# Patient Record
Sex: Female | Born: 1965 | Race: White | Hispanic: No | Marital: Married | Smoking: Never smoker
Health system: Southern US, Community
[De-identification: ages and names within clinical notes are randomized; demographics above are authoritative.]

---

## 2014-07-18 ENCOUNTER — Telehealth: Payer: Self-pay | Admitting: Primary Care

## 2014-07-18 NOTE — Telephone Encounter (Signed)
Patient scheduled a new patient appointment with Jae Dire.  Patient would like to have physical done on the same day as her appointment.  Can patient have a physical done on the same day?

## 2014-07-18 NOTE — Telephone Encounter (Signed)
Yes, that's fine. Will you please ensure we have enough time. Perhaps 45 min slot?  Thanks!

## 2014-07-29 ENCOUNTER — Ambulatory Visit: Payer: Self-pay | Admitting: Primary Care

## 2014-09-11 ENCOUNTER — Encounter: Payer: Self-pay | Admitting: Family Medicine

## 2014-09-11 ENCOUNTER — Other Ambulatory Visit (HOSPITAL_COMMUNITY)
Admission: RE | Admit: 2014-09-11 | Discharge: 2014-09-11 | Disposition: A | Discharge status: Home / Self Care | Payer: 59 | Source: Ambulatory Visit | Attending: Family Medicine | Admitting: Family Medicine

## 2014-09-11 ENCOUNTER — Ambulatory Visit (INDEPENDENT_AMBULATORY_CARE_PROVIDER_SITE_OTHER): Payer: 59 | Admitting: Family Medicine

## 2014-09-11 ENCOUNTER — Encounter (INDEPENDENT_AMBULATORY_CARE_PROVIDER_SITE_OTHER): Payer: Self-pay

## 2014-09-11 VITALS — BP 104/68 | HR 66 | Temp 98.6°F | Ht 61.0 in | Wt 137.0 lb

## 2014-09-11 DIAGNOSIS — Z1151 Encounter for screening for human papillomavirus (HPV): Secondary | ICD-10-CM | POA: Diagnosis present

## 2014-09-11 DIAGNOSIS — Z01419 Encounter for gynecological examination (general) (routine) without abnormal findings: Secondary | ICD-10-CM | POA: Insufficient documentation

## 2014-09-11 DIAGNOSIS — N941 Dyspareunia: Secondary | ICD-10-CM

## 2014-09-11 DIAGNOSIS — Z13220 Encounter for screening for lipoid disorders: Secondary | ICD-10-CM | POA: Diagnosis not present

## 2014-09-11 DIAGNOSIS — N898 Other specified noninflammatory disorders of vagina: Secondary | ICD-10-CM

## 2014-09-11 DIAGNOSIS — IMO0002 Reserved for concepts with insufficient information to code with codable children: Secondary | ICD-10-CM

## 2014-09-11 DIAGNOSIS — Z0000 Encounter for general adult medical examination without abnormal findings: Secondary | ICD-10-CM | POA: Insufficient documentation

## 2014-09-11 DIAGNOSIS — N938 Other specified abnormal uterine and vaginal bleeding: Secondary | ICD-10-CM | POA: Insufficient documentation

## 2014-09-11 NOTE — Assessment & Plan Note (Addendum)
No abnormality seen, ? Bartholin's versus other causing pain, swelling off and on. Pt to return when lesion present.

## 2014-09-11 NOTE — Progress Notes (Signed)
Pre visit review using our clinic review tool, if applicable. No additional management support is needed unless otherwise documented below in the visit note. 

## 2014-09-11 NOTE — Assessment & Plan Note (Signed)
Eval with labs, pap and US transvaginal and pelvic.  No abnormality seen on exam.

## 2014-09-11 NOTE — Progress Notes (Signed)
Subjective:    Patient ID: Laura Cox, female    DOB: 15-Mar-1965, 49 y.o.   MRN: [id number]  HPI 49 year old female presents to establish.  No recent MD. She has not had  CPX.   She has very heavy menses, occurs monthly but has to use super plus tampon and a pad for first 3 days, last 5-7 days. She has had DUB in last 3 years. She does have cramping in low abdomen.  No easy bruising, no bleeding.    When she has sex she has some pain in last 6-8 month.  She has  pain at vaginal lips at right side during intercourse. Occ feels hard ball/mass (size of BB)  to side.  Ache , no burning.  No pain deep inside.  No vaginal discharge.  No concern of STDs.    Review of Systems  Constitutional: Negative for fever and fatigue.  HENT: Negative for congestion.   Eyes: Negative for pain.  Respiratory: Negative for cough and shortness of breath.   Cardiovascular: Negative for chest pain, palpitations and leg swelling.  Gastrointestinal: Negative for abdominal pain.  Genitourinary: Negative for dysuria and vaginal bleeding.  Musculoskeletal: Positive for back pain.  Neurological: Negative for syncope, light-headedness and headaches.  Psychiatric/Behavioral: Negative for dysphoric mood.       Objective:    Physical Exam  Constitutional: Vital signs are normal. She appears well-developed and well-nourished. She is cooperative.  Non-toxic appearance. She does not appear ill. No distress.  HENT:  Head: Normocephalic.  Right Ear: Hearing, tympanic membrane, external ear and ear canal normal.  Left Ear: Hearing, tympanic membrane, external ear and ear canal normal.  Nose: Nose normal.  Eyes: Conjunctivae, EOM and lids are normal. Pupils are equal, round, and reactive to light. Lids are everted and swept, no foreign bodies found.  Neck: Trachea normal and normal range of motion. Neck supple. Carotid bruit is not present. No thyroid mass and no thyromegaly present.  Cardiovascular:  Normal rate, regular rhythm, S1 normal, S2 normal, normal heart sounds and intact distal pulses.  Exam reveals no gallop.   No murmur heard. Pulmonary/Chest: Effort normal and breath sounds normal. No respiratory distress. She has no wheezes. She has no rhonchi. She has no rales.  Abdominal: Soft. Normal appearance and bowel sounds are normal. She exhibits no distension, no fluid wave, no abdominal bruit and no mass. There is no hepatosplenomegaly. There is no tenderness. There is no rebound, no guarding and no CVA tenderness. No hernia.  Genitourinary: Vagina normal and uterus normal. No breast swelling, tenderness, discharge or bleeding. Pelvic exam was performed with patient supine. There is no rash, tenderness or lesion on the right labia. There is no rash, tenderness or lesion on the left labia. Uterus is not enlarged and not tender. Cervix exhibits no motion tenderness, no discharge and no friability. Right adnexum displays no mass, no tenderness and no fullness. Left adnexum displays no mass, no tenderness and no fullness.  Lymphadenopathy:    She has no cervical adenopathy.    She has no axillary adenopathy.  Neurological: She is alert. She has normal strength. No cranial nerve deficit or sensory deficit.  Skin: Skin is warm, dry and intact. No rash noted.  Psychiatric: Her speech is normal and behavior is normal. Judgment normal. Her mood appears not anxious. Cognition and memory are normal. She does not exhibit a depressed mood.          Assessment & Plan:  The patient's preventative maintenance and recommended screening tests for an annual wellness exam were reviewed in full today. Brought up to date unless services declined.  Counselled on the importance of diet, exercise, and its role in overall health and mortality. The patient's FH and SH was reviewed, including their home life, tobacco status, and drug and alcohol status.   Mammogram; due. Vaccines: uptodate Due for lab  eval. Nonsmoker STD testing.

## 2014-09-11 NOTE — Patient Instructions (Addendum)
Call to set up mammogram on your own.  Set up a lab only appt for eval.  Stop at front desk to set up ultrasound.

## 2014-09-17 ENCOUNTER — Other Ambulatory Visit: Payer: 59

## 2014-09-17 LAB — CYTOLOGY - PAP

## 2014-09-18 ENCOUNTER — Other Ambulatory Visit: Payer: 59

## 2014-09-18 ENCOUNTER — Encounter: Payer: Self-pay | Admitting: *Deleted

## 2014-09-19 ENCOUNTER — Telehealth: Payer: Self-pay

## 2014-09-19 MED ORDER — ALPRAZOLAM 0.25 MG PO TABS: ORAL_TABLET | ORAL | Status: DC

## 2014-09-19 NOTE — Telephone Encounter (Signed)
Pt left note; pt seen 09/11/14 and is scheduled for testing on 09/22/14; pt discussed at 09/11/14 appt about getting Valium to take prior to testing on 09/22/14. Pt request med sent to [facility] 09/19/14. Pt request cb.

## 2014-09-19 NOTE — Telephone Encounter (Signed)
Laura Cox notified prescription has been called into her pharmacy.

## 2014-09-19 NOTE — Telephone Encounter (Signed)
Alprazolam called into [facility]. 

## 2014-09-22 ENCOUNTER — Ambulatory Visit
Admission: RE | Admit: 2014-09-22 | Discharge: 2014-09-22 | Disposition: A | Discharge status: Home / Self Care | Payer: 59 | Source: Ambulatory Visit | Attending: Family Medicine | Admitting: Family Medicine

## 2014-09-22 DIAGNOSIS — IMO0002 Reserved for concepts with insufficient information to code with codable children: Secondary | ICD-10-CM

## 2014-09-22 DIAGNOSIS — N938 Other specified abnormal uterine and vaginal bleeding: Secondary | ICD-10-CM

## 2014-09-23 ENCOUNTER — Telehealth: Payer: Self-pay | Admitting: Family Medicine

## 2014-09-23 ENCOUNTER — Other Ambulatory Visit: Payer: 59

## 2014-09-23 DIAGNOSIS — IMO0002 Reserved for concepts with insufficient information to code with codable children: Secondary | ICD-10-CM

## 2014-09-23 DIAGNOSIS — N938 Other specified abnormal uterine and vaginal bleeding: Secondary | ICD-10-CM

## 2014-09-23 DIAGNOSIS — N898 Other specified noninflammatory disorders of vagina: Secondary | ICD-10-CM

## 2014-09-23 NOTE — Telephone Encounter (Signed)
Patient is asking for the results of her ultrasound done yesterday.

## 2014-09-23 NOTE — Telephone Encounter (Signed)
Please send recent US results.

## 2014-09-24 ENCOUNTER — Telehealth: Payer: Self-pay | Admitting: Family Medicine

## 2014-09-24 NOTE — Telephone Encounter (Signed)
Made pt appt with gyn. Pt is requesting call back with explanation on why one of the dx code was vaginal lesion. She said letter in mail stated pap was normal. This is concerning for the pt.   Please call pt on cell.

## 2014-09-25 ENCOUNTER — Other Ambulatory Visit (INDEPENDENT_AMBULATORY_CARE_PROVIDER_SITE_OTHER): Payer: 59

## 2014-09-25 DIAGNOSIS — N938 Other specified abnormal uterine and vaginal bleeding: Secondary | ICD-10-CM | POA: Diagnosis not present

## 2014-09-25 DIAGNOSIS — Z13220 Encounter for screening for lipoid disorders: Secondary | ICD-10-CM | POA: Diagnosis not present

## 2014-09-25 NOTE — Telephone Encounter (Signed)
Left message for Laura Cox to return my call.

## 2014-09-25 NOTE — Telephone Encounter (Signed)
Let pt know that there was no other clear way to code for the nodule she has felt at times in her genital area. She is correct no internal vaginal issue. I am sorry to have concerned her.

## 2014-09-26 ENCOUNTER — Encounter: Payer: Self-pay | Admitting: *Deleted

## 2014-09-26 LAB — CBC WITH DIFFERENTIAL/PLATELET
BASOS PCT: 0.4 % (ref 0.0–3.0)
Basophils Absolute: 0 10*3/uL (ref 0.0–0.1)
EOS ABS: 0.1 10*3/uL (ref 0.0–0.7)
Eosinophils Relative: 1.3 % (ref 0.0–5.0)
HEMATOCRIT: 37.8 % (ref 36.0–46.0)
Hemoglobin: 12.5 g/dL (ref 12.0–15.0)
LYMPHS PCT: 20.1 % (ref 12.0–46.0)
Lymphs Abs: 1.8 10*3/uL (ref 0.7–4.0)
MCHC: 33.2 g/dL (ref 30.0–36.0)
MCV: 90.5 fl (ref 78.0–100.0)
MONOS PCT: 6.6 % (ref 3.0–12.0)
Monocytes Absolute: 0.6 10*3/uL (ref 0.1–1.0)
NEUTROS ABS: 6.3 10*3/uL (ref 1.4–7.7)
Neutrophils Relative %: 71.6 % (ref 43.0–77.0)
PLATELETS: 182 10*3/uL (ref 150.0–400.0)
RBC: 4.18 Mil/uL (ref 3.87–5.11)
RDW: 13.4 % (ref 11.5–15.5)
WBC: 8.8 10*3/uL (ref 4.0–10.5)

## 2014-09-26 LAB — COMPREHENSIVE METABOLIC PANEL
ALT: 13 U/L (ref 0–35)
AST: 20 U/L (ref 0–37)
Albumin: 4 g/dL (ref 3.5–5.2)
Alkaline Phosphatase: 56 U/L (ref 39–117)
BUN: 16 mg/dL (ref 6–23)
CALCIUM: 9.1 mg/dL (ref 8.4–10.5)
CHLORIDE: 102 meq/L (ref 96–112)
CO2: 25 meq/L (ref 19–32)
Creatinine, Ser: 0.92 mg/dL (ref 0.40–1.20)
GFR: 69.56 mL/min (ref >60.00)
Glucose, Bld: 70 mg/dL (ref 70–99)
POTASSIUM: 4.3 meq/L (ref 3.5–5.1)
Sodium: 138 mEq/L (ref 135–145)
Total Bilirubin: 0.3 mg/dL (ref 0.2–1.2)
Total Protein: 7 g/dL (ref 6.0–8.3)

## 2014-09-26 LAB — LIPID PANEL
CHOL/HDL RATIO: 2
Cholesterol: 167 mg/dL (ref 0–200)
HDL: 75 mg/dL (ref >39.00)
LDL CALC: 81 mg/dL (ref 0–99)
NONHDL: 91.95
TRIGLYCERIDES: 55 mg/dL (ref 0.0–149.0)
VLDL: 11 mg/dL (ref 0.0–40.0)

## 2014-09-26 LAB — TSH: TSH: 2.28 u[IU]/mL (ref 0.35–4.50)

## 2014-09-26 NOTE — Telephone Encounter (Signed)
[name] notified as instructed by telephone. 

## 2015-01-01 ENCOUNTER — Ambulatory Visit: Payer: Self-pay | Admitting: Family Medicine

## 2015-09-25 ENCOUNTER — Ambulatory Visit (INDEPENDENT_AMBULATORY_CARE_PROVIDER_SITE_OTHER): Payer: 59 | Admitting: Family Medicine

## 2015-09-25 ENCOUNTER — Encounter: Payer: Self-pay | Admitting: Family Medicine

## 2015-09-25 DIAGNOSIS — N938 Other specified abnormal uterine and vaginal bleeding: Secondary | ICD-10-CM

## 2015-09-25 MED ORDER — LO LOESTRIN FE 1 MG-10 MCG / 10 MCG PO TABS: 1.0000 | ORAL_TABLET | Freq: Every day | ORAL | 11 refills | Status: DC

## 2015-09-25 NOTE — Progress Notes (Signed)
Pre visit review using our clinic review tool, if applicable. No additional management support is needed unless otherwise documented below in the visit note. 

## 2015-09-25 NOTE — Assessment & Plan Note (Signed)
Improved on OCPS.  Refilled.  If symptoms returning or wish to stop estrogen containing OCPs.. She may return to GYN for ablation.

## 2015-09-25 NOTE — Progress Notes (Signed)
   Subjective:    Patient ID: Laura Cox, female    DOB: 02-19-1965, 50 y.o.   MRN: [id number]  HPI   50 year old female presents for medication refill.  DUB noted at 2016 CPX. US showed thickened endometrium  Referred to GYN. Had  Internal eval.. No issues. Recommended ablation or OCP.  She chose OCPs. She has been on this for the last 8 months.  He menses has been more regular, less flow.  No SE to Lo Loestrin.    Review of Systems  Constitutional: Negative for fatigue and fever.  HENT: Negative for ear pain.   Eyes: Negative for pain.  Respiratory: Negative for chest tightness and shortness of breath.   Cardiovascular: Negative for chest pain, palpitations and leg swelling.  Gastrointestinal: Negative for abdominal pain.  Genitourinary: Negative for dysuria.       Objective:   Physical Exam  Constitutional: Vital signs are normal. She appears well-developed and well-nourished. She is cooperative.  Non-toxic appearance. She does not appear ill. No distress.  HENT:  Head: Normocephalic.  Right Ear: Hearing, tympanic membrane, external ear and ear canal normal. Tympanic membrane is not erythematous, not retracted and not bulging.  Left Ear: Hearing, tympanic membrane, external ear and ear canal normal. Tympanic membrane is not erythematous, not retracted and not bulging.  Nose: No mucosal edema or rhinorrhea. Right sinus exhibits no maxillary sinus tenderness and no frontal sinus tenderness. Left sinus exhibits no maxillary sinus tenderness and no frontal sinus tenderness.  Mouth/Throat: Uvula is midline, oropharynx is clear and moist and mucous membranes are normal.  Eyes: Conjunctivae, EOM and lids are normal. Pupils are equal, round, and reactive to light. Lids are everted and swept, no foreign bodies found.  Neck: Trachea normal and normal range of motion. Neck supple. Carotid bruit is not present. No thyroid mass and no thyromegaly present.  Cardiovascular: Normal  rate, regular rhythm, S1 normal, S2 normal, normal heart sounds, intact distal pulses and normal pulses.  Exam reveals no gallop and no friction rub.   No murmur heard. Pulmonary/Chest: Effort normal and breath sounds normal. No tachypnea. No respiratory distress. She has no decreased breath sounds. She has no wheezes. She has no rhonchi. She has no rales.  Abdominal: Soft. Normal appearance and bowel sounds are normal. There is no tenderness.  Neurological: She is alert.  Skin: Skin is warm, dry and intact. No rash noted.  Psychiatric: Her speech is normal and behavior is normal. Judgment and thought content normal. Her mood appears not anxious. Cognition and memory are normal. She does not exhibit a depressed mood.          Assessment & Plan:

## 2015-12-17 ENCOUNTER — Encounter: Payer: 59 | Admitting: Family Medicine

## 2016-02-23 ENCOUNTER — Encounter: Payer: 59 | Admitting: Family Medicine

## 2016-03-17 ENCOUNTER — Encounter: Payer: 59 | Admitting: Family Medicine

## 2016-06-30 ENCOUNTER — Encounter: Payer: 59 | Admitting: Family Medicine

## 2016-07-22 ENCOUNTER — Encounter: Payer: 59 | Admitting: Family Medicine

## 2016-09-06 ENCOUNTER — Encounter: Payer: Self-pay | Admitting: Family Medicine

## 2016-09-06 ENCOUNTER — Ambulatory Visit (INDEPENDENT_AMBULATORY_CARE_PROVIDER_SITE_OTHER): Payer: 59 | Admitting: Family Medicine

## 2016-09-06 VITALS — BP 110/62 | HR 76 | Temp 98.7°F | Ht 61.0 in | Wt 123.5 lb

## 2016-09-06 DIAGNOSIS — Z13220 Encounter for screening for lipoid disorders: Secondary | ICD-10-CM

## 2016-09-06 DIAGNOSIS — Z0000 Encounter for general adult medical examination without abnormal findings: Secondary | ICD-10-CM | POA: Diagnosis not present

## 2016-09-06 NOTE — Patient Instructions (Addendum)
Return for fasting labs when able.  Keep up healthy eating habits and  regular exercise.

## 2016-09-06 NOTE — Progress Notes (Signed)
Subjective:    Patient ID: Laura Cox, female    DOB: [birth year], 51 y.o.   MRN: [id number]  HPI The patient is here for annual wellness exam and preventative care.    Now on OCPs for DUB.  She does not bleed  When is supposed to   Light  bleeding mid cycle for 1 week.  Occ does not have menses.  Exercise: regular exercise 3-5 days a week  Body mass index is 23.34 kg/m. Diet:  healthy diet, water, calcium   Blood pressure 110/62, pulse 76, temperature 98.7 F (37.1 C), temperature source Oral, height 5\' 1"  (1.549 m), weight 123 lb 8 oz (56 kg). Social History /Family History/Past Medical History reviewed in detail and updated in EMR if needed.  Review of Systems  Constitutional: Negative for fatigue and fever.  HENT: Negative for congestion.   Eyes: Negative for pain.  Respiratory: Negative for cough and shortness of breath.   Cardiovascular: Negative for chest pain, palpitations and leg swelling.  Gastrointestinal: Negative for abdominal pain.  Genitourinary: Negative for dysuria and vaginal bleeding.  Musculoskeletal: Negative for back pain.  Neurological: Negative for syncope, light-headedness and headaches.  Psychiatric/Behavioral: Negative for dysphoric mood.       Objective:   Physical Exam  Constitutional: Vital signs are normal. She appears well-developed and well-nourished. She is cooperative.  Non-toxic appearance. She does not appear ill. No distress.  HENT:  Head: Normocephalic.  Right Ear: Hearing, tympanic membrane, external ear and ear canal normal.  Left Ear: Hearing, tympanic membrane, external ear and ear canal normal.  Nose: Nose normal.  Eyes: Pupils are equal, round, and reactive to light. Conjunctivae, EOM and lids are normal. Lids are everted and swept, no foreign bodies found.  Neck: Trachea normal and normal range of motion. Neck supple. Carotid bruit is not present. No thyroid mass and no thyromegaly present.  Cardiovascular: Normal  rate, regular rhythm, S1 normal, S2 normal, normal heart sounds and intact distal pulses.  Exam reveals no gallop.   No murmur heard. Pulmonary/Chest: Effort normal and breath sounds normal. No respiratory distress. She has no wheezes. She has no rhonchi. She has no rales.  Abdominal: Soft. Normal appearance and bowel sounds are normal. She exhibits no distension, no fluid wave, no abdominal bruit and no mass. There is no hepatosplenomegaly. There is no tenderness. There is no rebound, no guarding and no CVA tenderness. No hernia.  Lymphadenopathy:    She has no cervical adenopathy.    She has no axillary adenopathy.  Neurological: She is alert. She has normal strength. No cranial nerve deficit or sensory deficit.  Skin: Skin is warm, dry and intact. No rash noted.  Psychiatric: Her speech is normal and behavior is normal. Judgment normal. Her mood appears not anxious. Cognition and memory are normal. She does not exhibit a depressed mood.          Assessment & Plan:  The patient's preventative maintenance and recommended screening tests for an annual wellness exam were reviewed in full today. Brought up to date unless services declined.  Counselled on the importance of diet, exercise, and its role in overall health and mortality. The patient's FH and SH was reviewed, including their home life, tobacco status, and drug and alcohol status.   Vaccines: uptodate Pap/DVE:  2016 pap nml, neg hpv, repeat in 5 years Mammo:  She plans starting age 51 given low risk. Colon:  No early family history. Smoking Status: non smoker ETOH/ drug  ZOX:WRUE  HIV screen:  refused

## 2016-09-16 ENCOUNTER — Other Ambulatory Visit: Payer: 59

## 2016-09-26 ENCOUNTER — Other Ambulatory Visit: Payer: Self-pay | Admitting: Family Medicine

## 2017-10-18 ENCOUNTER — Other Ambulatory Visit: Payer: Self-pay | Admitting: Family Medicine

## 2017-10-19 ENCOUNTER — Telehealth: Payer: Self-pay | Admitting: Family Medicine

## 2017-10-19 MED ORDER — LO LOESTRIN FE 1 MG-10 MCG / 10 MCG PO TABS: 1.0000 | ORAL_TABLET | Freq: Every day | ORAL | 2 refills | Status: DC

## 2017-10-19 NOTE — Telephone Encounter (Signed)
Refill was sent in yesterday for 1 pack.  I called [facility] and ask that they add on 2 additional refills.  That should get Laura Cox to her appointment in December.

## 2017-10-19 NOTE — Addendum Note (Signed)
Addended by: Damita LackLORING, Malaisha Silliman S on: 10/19/2017 11:32 AM   Modules accepted: Orders

## 2017-10-19 NOTE — Telephone Encounter (Signed)
Copied from CRM [phone]. Topic: Quick Communication - Office Called Patient >> Oct 19, 2017 10:19 AM Carrie MewHayes, Robin B wrote: Reason for CRM: left message asking pt to call office.  Please schedule physical with fasting labs prior with dr Ermalene Searingbedsole  next physical appointment 12/13 >> Oct 19, 2017 10:57 AM Carin PrimroseWalter, Linda F wrote: Patient made an appt on Friday, 01/12/18 at 2:30pm.  However she will run out of her birth control meds before that appt.  She would like to have that prescription refilled up until her physical.

## 2017-11-19 ENCOUNTER — Emergency Department (HOSPITAL_COMMUNITY)
Admission: EM | Admit: 2017-11-19 | Discharge: 2017-11-19 | Disposition: A | Discharge status: Home / Self Care | Payer: 59 | Attending: Emergency Medicine | Admitting: Emergency Medicine

## 2017-11-19 ENCOUNTER — Encounter (HOSPITAL_COMMUNITY): Payer: Self-pay | Admitting: Emergency Medicine

## 2017-11-19 ENCOUNTER — Emergency Department (HOSPITAL_COMMUNITY): Payer: 59

## 2017-11-19 DIAGNOSIS — R200 Anesthesia of skin: Secondary | ICD-10-CM | POA: Diagnosis present

## 2017-11-19 DIAGNOSIS — Z79899 Other long term (current) drug therapy: Secondary | ICD-10-CM | POA: Insufficient documentation

## 2017-11-19 DIAGNOSIS — H8110 Benign paroxysmal vertigo, unspecified ear: Secondary | ICD-10-CM | POA: Insufficient documentation

## 2017-11-19 LAB — URINALYSIS, ROUTINE W REFLEX MICROSCOPIC
Bilirubin Urine: NEGATIVE
Glucose, UA: NEGATIVE mg/dL
Hgb urine dipstick: NEGATIVE
KETONES UR: 5 mg/dL — AB
LEUKOCYTES UA: NEGATIVE
NITRITE: NEGATIVE
PH: 9 — AB (ref 5.0–8.0)
PROTEIN: NEGATIVE mg/dL
Specific Gravity, Urine: 1.014 (ref 1.005–1.030)

## 2017-11-19 LAB — BASIC METABOLIC PANEL
ANION GAP: 9 (ref 5–15)
BUN: 14 mg/dL (ref 6–20)
CHLORIDE: 103 mmol/L (ref 98–111)
CO2: 26 mmol/L (ref 22–32)
CREATININE: 0.96 mg/dL (ref 0.44–1.00)
Calcium: 9.1 mg/dL (ref 8.9–10.3)
GFR calc non Af Amer: >60 mL/min (ref >60)
Glucose, Bld: 112 mg/dL — ABNORMAL HIGH (ref 70–99)
Potassium: 4 mmol/L (ref 3.5–5.1)
SODIUM: 138 mmol/L (ref 135–145)

## 2017-11-19 LAB — CBC
HEMATOCRIT: 42.6 % (ref 36.0–46.0)
Hemoglobin: 14.2 g/dL (ref 12.0–15.0)
MCH: 31.7 pg (ref 26.0–34.0)
MCHC: 33.3 g/dL (ref 30.0–36.0)
MCV: 95.1 fL (ref 80.0–100.0)
NRBC: 0 % (ref 0.0–0.2)
Platelets: 228 10*3/uL (ref 150–400)
RBC: 4.48 MIL/uL (ref 3.87–5.11)
RDW: 11.7 % (ref 11.5–15.5)
WBC: 8.8 10*3/uL (ref 4.0–10.5)

## 2017-11-19 LAB — I-STAT BETA HCG BLOOD, ED (MC, WL, AP ONLY): I-stat hCG, quantitative: <5 m[IU]/mL (ref <5)

## 2017-11-19 MED ORDER — MECLIZINE HCL 25 MG PO TABS: 25.0000 mg | ORAL_TABLET | Freq: Three times a day (TID) | ORAL | 0 refills | Status: DC | PRN

## 2017-11-19 MED ORDER — ONDANSETRON HCL 4 MG/2ML IJ SOLN
4.0000 mg | Freq: Once | INTRAMUSCULAR | Status: AC
  Administered 2017-11-19: 4 mg via INTRAVENOUS
  Filled 2017-11-19: qty 2

## 2017-11-19 MED ORDER — MECLIZINE HCL 25 MG PO TABS
25.0000 mg | ORAL_TABLET | Freq: Once | ORAL | Status: AC
  Administered 2017-11-19: 25 mg via ORAL
  Filled 2017-11-19: qty 1

## 2017-11-19 MED ORDER — SODIUM CHLORIDE 0.9 % IV BOLUS
1000.0000 mL | Freq: Once | INTRAVENOUS | Status: AC
  Administered 2017-11-19: 1000 mL via INTRAVENOUS

## 2017-11-19 MED ORDER — ONDANSETRON HCL 4 MG PO TABS: 4.0000 mg | ORAL_TABLET | Freq: Four times a day (QID) | ORAL | 0 refills | Status: DC

## 2017-11-19 NOTE — ED Notes (Signed)
ED Provider at bedside. 

## 2017-11-19 NOTE — ED Notes (Signed)
Patient states that she wants to wait to do orthostatic VS due to extreme dizziness.

## 2017-11-19 NOTE — ED Provider Notes (Signed)
[facility] EMERGENCY DEPARTMENT Provider Note   CSN: [id number] Arrival date & time: 11/19/17  0518     History   Chief Complaint Chief Complaint  Patient presents with  . Dizziness  . Numbness    HPI Laura Cox is a 52 y.o. female.  The history is provided by the patient. No language interpreter was used.  Dizziness    52 year old female without any significant past medical history presenting complaining of left arm numbness and dizziness.  Patient last known normal was last night before she went to bed.  She remember waking up at 3 AM this morning and while she turns over she developed acute onset of dizziness.  She described as room spinning sensation and felt very nauseous.  She went to use the bathroom and was in the bathroom for approximately an hour.  15 minutes into it she developed a tingling sensation to her left arm follows with several episodes of panic attack.  Her husband brought her here for further evaluation.  She mentioned the tingling sensation in the left arm has since improved but not fully resolved.  She still endorse bouts of dizziness sometimes improve if she sits forward.  She also report feeling dehydrated.  States that she normally drinks several liters of water daily but did not drink enough yesterday.  She denies any associated fever, chills, headache, vision changes, diplopia, neck pain, ear pain, chest pain, trouble breathing, abdominal pain, diarrhea constipation or rash.  She did report having some ringing sensation to both ears.  She denies any specific treatment tried.  No history of diabetes, hypertension, prior stroke.  No recent sickness.  History reviewed. No pertinent past medical history.  There are no active problems to display for this patient.   Past Surgical History:  Procedure Laterality Date  . CESAREAN SECTION     breech     OB History   None      Home Medications    Prior to Admission medications     Medication Sig Start Date End Date Taking? Authorizing Provider  LO LOESTRIN FE 1 MG-10 MCG / 10 MCG tablet Take 1 tablet by mouth daily. Patient taking differently: Take 1 tablet by mouth at bedtime.  10/19/17  Yes Bedsole, Amy E, MD  spironolactone (ALDACTONE) 50 MG tablet Take 50 mg by mouth at bedtime.  09/24/15  Yes [provider]    Family History Family History  Problem Relation Age of Onset  . Heart disease Father 15       MI  . Bladder Cancer Father 6  . Hypertension Mother   . Brain cancer Brother 69       glioblastoma    Social History Social History   Tobacco Use  . Smoking status: Never Smoker  . Smokeless tobacco: Never Used  Substance Use Topics  . Alcohol use: No    Alcohol/week: 0.0 standard drinks  . Drug use: No     Allergies   Food and Doxycycline   Review of Systems Review of Systems  Neurological: Positive for dizziness.  All other systems reviewed and are negative.    Physical Exam Updated Vital Signs BP 127/76   Pulse 81   Temp 98 F (36.7 C)   Resp 16   LMP 10/19/2017 (Approximate)   SpO2 100%   Physical Exam  Constitutional: She is oriented to person, place, and time. She appears well-developed and well-nourished. No distress.  Patient sitting upright leaning over the  bed, appears uncomfortable but nontoxic  HENT:  Head: Atraumatic.  TMs normal bilaterally no evidence of cerumen impaction.  Eyes: Pupils are equal, round, and reactive to light. Conjunctivae and EOM are normal.  Neck: Normal range of motion. Neck supple.  No carotid bruit  Cardiovascular: Normal rate and regular rhythm.  Pulmonary/Chest: Effort normal and breath sounds normal.  Abdominal: Soft. Bowel sounds are normal. She exhibits no distension. There is no tenderness.  Neurological: She is alert and oriented to person, place, and time.  Neurologic exam:  Speech clear, pupils equal round reactive to light, extraocular movements intact  Normal  peripheral visual fields Cranial nerves III through XII normal including no facial droop Follows commands, moves all extremities x4, normal strength to bilateral upper and lower extremities at all major muscle groups including grip Sensation normal to light touch, subjective decreased sensation to L arm compare to right with equal strength and intact radial pulses Coordination intact, no limb ataxia, finger-nose-finger normal No pronator drift Gait normal   Skin: No rash noted.  Psychiatric: She has a normal mood and affect.  Nursing note and vitals reviewed.    ED Treatments / Results  Labs (all labs ordered are listed, but only abnormal results are displayed) Labs Reviewed  BASIC METABOLIC PANEL - Abnormal; Notable for the following components:      Result Value   Glucose, Bld 112 (*)    All other components within normal limits  URINALYSIS, ROUTINE W REFLEX MICROSCOPIC - Abnormal; Notable for the following components:   pH 9.0 (*)    Ketones, ur 5 (*)    All other components within normal limits  CBC  I-STAT BETA HCG BLOOD, ED ([facility], [facility], [facility] ONLY)    EKG EKG Interpretation  Date/Time:  Sunday November 19 2017 05:24:50 EDT Ventricular Rate:  73 PR Interval:  160 QRS Duration: 86 QT Interval:  394 QTC Calculation: 434 R Axis:   76 Text Interpretation:  Normal sinus rhythm Normal ECG Confirmed by Tilden Fossa [phone]) on 11/19/2017 6:44:39 AM Also confirmed by Tilden Fossa [phone]), editor Josephine Igo [id number])  on 11/19/2017 9:48:38 AM   Radiology Mr Brain Wo Contrast  Result Date: 11/19/2017 CLINICAL DATA:  Vertigo, episodic, peripheral. Acute onset of dizziness, vomiting, and left arm numbness. EXAM: MRI HEAD WITHOUT CONTRAST TECHNIQUE: Multiplanar, multiecho pulse sequences of the brain and surrounding structures were obtained without intravenous contrast. COMPARISON:  None. FINDINGS: Brain: The diffusion-weighted images demonstrate no acute or subacute  infarction. No significant white matter disease is present. Basal ganglia are within normal limits. The internal auditory canals are within normal limits bilaterally. Inner ear structures are unremarkable. Brainstem and cerebellum are normal. Vascular: Flow is present in the major intracranial arteries. Skull and upper cervical spine: Skull base is within normal limits. Craniocervical junction is normal. Upper cervical spine is within normal limits. Sinuses/Orbits: Polyp or mucous retention cyst is present in the posterior inferior left maxillary sinus. The remaining paranasal sinuses and the mastoid air cells are clear. Globes and orbits are within normal limits. IMPRESSION: 1. Negative MRI of the brain. No acute or focal lesion to explain dizziness or upper extremity numbness. Electronically Signed   By: Marin Roberts M.D.   On: 11/19/2017 09:13    Procedures Procedures (including critical care time)  Medications Ordered in ED Medications  ondansetron (ZOFRAN) injection 4 mg (4 mg Intravenous Given 11/19/17 0633)  meclizine (ANTIVERT) tablet 25 mg (25 mg Oral Given 11/19/17 0633)  sodium chloride 0.9 %  bolus 1,000 mL (0 mLs Intravenous Stopped 11/19/17 0740)     Initial Impression / Assessment and Plan / ED Course  I have reviewed the triage vital signs and the nursing notes.  Pertinent labs & imaging results that were available during my care of the patient were reviewed by me and considered in my medical decision making (see chart for details).     BP 98/66   Pulse 61   Temp 98 F (36.7 C)   Resp 14   Ht 5\' 1"  (1.549 m)   Wt 57.6 kg   LMP 10/19/2017 (Approximate)   SpO2 97%   BMI 24.00 kg/m    Final Clinical Impressions(s) / ED Diagnoses   Final diagnoses:  Benign paroxysmal positional vertigo, unspecified laterality    ED Discharge Orders         Ordered    ondansetron (ZOFRAN) 4 MG tablet  Every 6 hours     11/19/17 1018    meclizine (ANTIVERT) 25 MG tablet  3  times daily PRN     11/19/17 1018         6:25 AM Patient here with room spinning sensation dizziness that started approximately 3 and half hours ago and also complaining of change sensation to her left armpit.  Dizziness seems to be brought on by positional changes.  She does have some subjective decreased sensation to her left arm compared to right however no pronator drift or decrease in strength.  She does not have any significant risk factor for stroke  10:16 AM Urine shows no signs of urinary infection, pregnancy test is negative, electrolytes panels are reassuring,Normal H&H and normal WBC.  Brain MRI without any acute finding.  After receiving treatment, patient felt better, able to ambulate with a steady slow gait.  Suspect BPPV causing her symptoms.  Patient discharged home with antinausea medication and with meclizine.  Outpatient follow-up recommended.  Return precautions discussed.  Blood pressure is a bit on the soft side, recommend continue with hydration.  Her orthostatic vital signs normal.   Fayrene Helper, PA-C 11/19/17 1019    Cardama, Amadeo Garnet, MD 11/20/17 [phone]

## 2017-11-19 NOTE — ED Notes (Signed)
Pt ambulated to RR with slow steady gait, NAD.

## 2017-11-19 NOTE — ED Notes (Signed)
Pt transported to MRI 

## 2017-11-19 NOTE — ED Notes (Signed)
Patient escorted to treatment room, placed in gown and placed on cardiac monitor.

## 2017-11-19 NOTE — ED Triage Notes (Addendum)
Pt reports that she turned over in bed at 3 am and felt very dizzy, she got up and drank some water, states she began vomiting, around 320 she began having L arm numbness. Patient states she got very anxious after she started feeling dizzy. Pt a/ox4, resp e/u, speech clear, face symmetrical, grip strength equal, sensation intact bilaterally.

## 2017-11-20 ENCOUNTER — Telehealth: Payer: Self-pay | Admitting: *Deleted

## 2017-11-20 NOTE — Telephone Encounter (Signed)
Copied from CRM [id number]. Topic: Quick Communication - See Telephone Encounter >> Nov 20, 2017  3:54 PM Herby Abraham C wrote: CRM for notification. See Telephone encounter for: 11/20/17.  Pt called in to be advised. Pt was seen at the ED for vertigo. She was advised to follow up with provider. Pt states that she is not sure how,why, what the provider could do for vertigo. Pt would like to know if provider could review her results from hospital visit and advise her?   Please assist.   CB: [phone]

## 2017-11-21 NOTE — Telephone Encounter (Signed)
Patient calling and would like to know if she can pick up the BPPV handouts at the office? Please advise.

## 2017-11-21 NOTE — Telephone Encounter (Signed)
Left message for Barbra that per Dr. Ermalene Searing she does not need ER follow up. I advised I would be mailing her some handouts on BPPV and home positional exercises for her to try and help with the vertigo. If symptoms are  not improving in 2 weeks she will need to follow up.

## 2017-11-21 NOTE — Telephone Encounter (Signed)
Left message for Laura Cox that I will leave the handouts up at the front desk for her to pick up.

## 2017-11-21 NOTE — Telephone Encounter (Signed)
No need for ER follow up. Pt Dx with benign paroxsysmal positional vertigo.. Negative work up.  Send info on BPPV from Epic and home positional exercises.. If not improving in 2 weeks pt to follow up. OK to make appt for ER follow up to disucss if pt prefers.

## 2018-01-05 ENCOUNTER — Telehealth: Payer: Self-pay | Admitting: Family Medicine

## 2018-01-05 ENCOUNTER — Other Ambulatory Visit: Payer: 59

## 2018-01-05 DIAGNOSIS — Z13220 Encounter for screening for lipoid disorders: Secondary | ICD-10-CM

## 2018-01-05 NOTE — Telephone Encounter (Signed)
-----   Message from Wendi MayaLauren Greeson, RT sent at 12/25/2017  2:28 PM EST ----- Regarding: Lab orders for Friday Dec 6th Please enter CPE lab orders for 01/05/18. Thanks!

## 2018-01-09 ENCOUNTER — Other Ambulatory Visit: Payer: 59

## 2018-01-12 ENCOUNTER — Encounter: Payer: 59 | Admitting: Family Medicine

## 2019-05-16 ENCOUNTER — Telehealth: Payer: Self-pay | Admitting: Family Medicine

## 2019-05-16 DIAGNOSIS — Z13220 Encounter for screening for lipoid disorders: Secondary | ICD-10-CM

## 2019-05-16 NOTE — Telephone Encounter (Signed)
Duplicate

## 2019-05-16 NOTE — Telephone Encounter (Signed)
-----   Message from Alvina Chou sent at 05/02/2019  3:37 PM EDT ----- Regarding: Lab orders for Friday, 4.16.21 Patient is scheduled for CPX labs, please order future labs, Thanks , Camelia Eng

## 2019-05-17 ENCOUNTER — Other Ambulatory Visit (INDEPENDENT_AMBULATORY_CARE_PROVIDER_SITE_OTHER): Payer: 59

## 2019-05-17 ENCOUNTER — Other Ambulatory Visit: Payer: Self-pay

## 2019-05-17 DIAGNOSIS — Z13220 Encounter for screening for lipoid disorders: Secondary | ICD-10-CM | POA: Diagnosis not present

## 2019-05-17 LAB — COMPREHENSIVE METABOLIC PANEL
ALT: 11 U/L (ref 0–35)
AST: 14 U/L (ref 0–37)
Albumin: 4.4 g/dL (ref 3.5–5.2)
Alkaline Phosphatase: 61 U/L (ref 39–117)
BUN: 17 mg/dL (ref 6–23)
CO2: 31 mEq/L (ref 19–32)
Calcium: 9.2 mg/dL (ref 8.4–10.5)
Chloride: 102 mEq/L (ref 96–112)
Creatinine, Ser: 0.95 mg/dL (ref 0.40–1.20)
GFR: 61.87 mL/min (ref >60.00)
Glucose, Bld: 87 mg/dL (ref 70–99)
Potassium: 4.2 mEq/L (ref 3.5–5.1)
Sodium: 137 mEq/L (ref 135–145)
Total Bilirubin: 0.5 mg/dL (ref 0.2–1.2)
Total Protein: 7.3 g/dL (ref 6.0–8.3)

## 2019-05-17 LAB — LIPID PANEL
Cholesterol: 163 mg/dL (ref 0–200)
HDL: 69.9 mg/dL (ref >39.00)
LDL Cholesterol: 84 mg/dL (ref 0–99)
NonHDL: 92.81
Total CHOL/HDL Ratio: 2
Triglycerides: 46 mg/dL (ref 0.0–149.0)
VLDL: 9.2 mg/dL (ref 0.0–40.0)

## 2019-05-17 NOTE — Progress Notes (Signed)
No critical labs need to be addressed urgently. We will discuss labs in detail at upcoming office visit.   

## 2019-05-24 ENCOUNTER — Ambulatory Visit (INDEPENDENT_AMBULATORY_CARE_PROVIDER_SITE_OTHER): Payer: 59 | Admitting: Family Medicine

## 2019-05-24 ENCOUNTER — Encounter: Payer: Self-pay | Admitting: Family Medicine

## 2019-05-24 ENCOUNTER — Other Ambulatory Visit: Payer: Self-pay

## 2019-05-24 VITALS — BP 90/70 | HR 84 | Temp 98.5°F | Ht 61.0 in | Wt 141.5 lb

## 2019-05-24 DIAGNOSIS — Z0000 Encounter for general adult medical examination without abnormal findings: Secondary | ICD-10-CM

## 2019-05-24 DIAGNOSIS — L700 Acne vulgaris: Secondary | ICD-10-CM

## 2019-05-24 DIAGNOSIS — L309 Dermatitis, unspecified: Secondary | ICD-10-CM

## 2019-05-24 DIAGNOSIS — Z1211 Encounter for screening for malignant neoplasm of colon: Secondary | ICD-10-CM

## 2019-05-24 NOTE — Assessment & Plan Note (Signed)
Improved with  To[pical steroid.

## 2019-05-24 NOTE — Progress Notes (Signed)
Chief Complaint  Patient presents with  . Annual Exam    History of Present Illness: HPI  The patient is here for annual wellness exam and preventative care.    She feels she is going through menses.  She has been more tired. She has had irregular menses in last several cycles... more sporadic, then 2 menses in March.  No CP, no SOB, no dizziness.  The 10-year ASCVD risk score Mikey Bussing DC Brooke Bonito., et al., 2013) is: 0.4%   Values used to calculate the score:     Age: 54 years     Sex: Female     Is Non-Hispanic African American: No     Diabetic: No     Tobacco smoker: No     Systolic Blood Pressure: 90 mmHg     Is BP treated: No     HDL Cholesterol: 69.9 mg/dL     Total Cholesterol: 163 mg/dL   Diet: healthy  Exercise:3-4 days a week.Marland Kitchen spin bike  This visit occurred during the SARS-CoV-2 public health emergency.  Safety protocols were in place, including screening questions prior to the visit, additional usage of staff PPE, and extensive cleaning of exam room while observing appropriate contact time as indicated for disinfecting solutions.   COVID 19 screen:  No recent travel or known exposure to COVID19 The patient denies respiratory symptoms of COVID 19 at this time. The importance of social distancing was discussed today.     Review of Systems  Constitutional: Negative for chills and fever.  HENT: Negative for congestion and ear pain.   Eyes: Negative for pain and redness.  Respiratory: Negative for cough and shortness of breath.   Cardiovascular: Negative for chest pain, palpitations and leg swelling.  Gastrointestinal: Negative for abdominal pain, blood in stool, constipation, diarrhea, nausea and vomiting.  Genitourinary: Negative for dysuria.  Musculoskeletal: Negative for falls and myalgias.  Skin: Negative for rash.  Neurological: Negative for dizziness.  Psychiatric/Behavioral: Negative for depression. The patient is not nervous/anxious.       History reviewed.  No pertinent past medical history.  reports that she has never smoked. She has never used smokeless tobacco. She reports that she does not drink alcohol or use drugs.   Current Outpatient Medications:  .  spironolactone (ALDACTONE) 50 MG tablet, Take 50 mg by mouth at bedtime. , Disp: , Rfl:  .  triamcinolone ointment (KENALOG) 0.1 %, , Disp: , Rfl:    Observations/Objective: Blood pressure 90/70, pulse 84, temperature 98.5 F (36.9 C), temperature source Temporal, height 5\' 1"  (1.549 m), weight 141 lb 8 oz (64.2 kg), SpO2 99 %. Body mass index is 26.74 kg/m.  Wt Readings from Last 3 Encounters:  05/24/19 141 lb 8 oz (64.2 kg)  11/19/17 127 lb (57.6 kg)  09/06/16 123 lb 8 oz (56 kg)     Physical Exam Constitutional:      General: She is not in acute distress.    Appearance: Normal appearance. She is well-developed. She is not ill-appearing or toxic-appearing.  HENT:     Head: Normocephalic.     Right Ear: Hearing, tympanic membrane, ear canal and external ear normal.     Left Ear: Hearing, tympanic membrane, ear canal and external ear normal.     Nose: Nose normal.  Eyes:     General: Lids are normal. Lids are everted, no foreign bodies appreciated.     Conjunctiva/sclera: Conjunctivae normal.     Pupils: Pupils are equal, round, and reactive to light.  Neck:     Thyroid: No thyroid mass or thyromegaly.     Vascular: No carotid bruit.     Trachea: Trachea normal.  Cardiovascular:     Rate and Rhythm: Normal rate and regular rhythm.     Heart sounds: Normal heart sounds, S1 normal and S2 normal. No murmur. No gallop.   Pulmonary:     Effort: Pulmonary effort is normal. No respiratory distress.     Breath sounds: Normal breath sounds. No wheezing, rhonchi or rales.  Abdominal:     General: Bowel sounds are normal. There is no distension or abdominal bruit.     Palpations: Abdomen is soft. There is no fluid wave or mass.     Tenderness: There is no abdominal tenderness.  There is no guarding or rebound.     Hernia: No hernia is present.  Musculoskeletal:     Cervical back: Normal range of motion and neck supple.  Lymphadenopathy:     Cervical: No cervical adenopathy.  Skin:    General: Skin is warm and dry.     Findings: No rash.  Neurological:     Mental Status: She is alert.     Cranial Nerves: No cranial nerve deficit.     Sensory: No sensory deficit.  Psychiatric:        Mood and Affect: Mood is not anxious or depressed.        Speech: Speech normal.        Behavior: Behavior normal. Behavior is cooperative.        Judgment: Judgment normal.      Assessment and Plan   The patient's preventative maintenance and recommended screening tests for an annual wellness exam were reviewed in full today. Brought up to date unless services declined.  Counselled on the importance of diet, exercise, and its role in overall health and mortality. The patient's FH and SH was reviewed, including their home life, tobacco status, and drug and alcohol status.    Vaccines: uptodate Tdap.  She has completed COVID19 series. Pap/DVE:  2016 pap nml, neg hpv, repeat in 5 years.. due SHE WILL RETURN FOR THIS. Mammo: overdue Colon:  No early family history., due for screening.. plan Smoking Status: non smoker ETOH/ drug GNO:IBBC/WUGQ HIV screen: refused  Eczema Improved with  To[pical steroid.  Acne vulgaris Well controlled on spironolactone.. per Derm.. Dr. Emily Filbert.    Kerby Nora, MD

## 2019-05-24 NOTE — Assessment & Plan Note (Signed)
Well controlled on spironolactone.. per Derm.. Dr. Emily Filbert.

## 2019-05-24 NOTE — Patient Instructions (Addendum)
You will be called in the next several weeks to have the colonoscopy set up!  Please call the location of your choice from the menu below to schedule your Mammogram and/or Bone Density appointment.    [place]   1. [facility]                      Phone:  [phone] [address]                               [place], [place] [address]                                                             Services: Traditional and 3D Mammogram, Bone Density   2. [facility]                 Phone: [phone] [address]                                                       [place], [place] [address]    Service: Bone Density ONLY   *this site does NOT perform mammograms  3. [facility]                        Phone:  [phone] [address]                                  [place], [place] [address]                                            Services:  3D Mammogram and Bone Density    [place]  1. [facility]   Phone:  [phone]   [address]                                                                            [place], [place] [address]                                            Services: 3D Mammogram and Bone Density  2. [facility])  Phone:  [phone]   [address]  [place], [place] [address]                                              Services:  3D Mammogram and Bone Density

## 2019-07-05 ENCOUNTER — Encounter: Payer: Self-pay | Admitting: Family Medicine

## 2019-07-05 ENCOUNTER — Other Ambulatory Visit (HOSPITAL_COMMUNITY)
Admission: RE | Admit: 2019-07-05 | Discharge: 2019-07-05 | Disposition: A | Discharge status: Home / Self Care | Payer: 59 | Source: Ambulatory Visit | Attending: Family Medicine | Admitting: Family Medicine

## 2019-07-05 ENCOUNTER — Other Ambulatory Visit: Payer: Self-pay

## 2019-07-05 ENCOUNTER — Ambulatory Visit (INDEPENDENT_AMBULATORY_CARE_PROVIDER_SITE_OTHER): Payer: 59 | Admitting: Family Medicine

## 2019-07-05 VITALS — BP 110/80 | HR 74 | Temp 98.5°F | Ht 61.0 in | Wt 137.5 lb

## 2019-07-05 DIAGNOSIS — Z124 Encounter for screening for malignant neoplasm of cervix: Secondary | ICD-10-CM

## 2019-07-05 DIAGNOSIS — Z01419 Encounter for gynecological examination (general) (routine) without abnormal findings: Secondary | ICD-10-CM

## 2019-07-05 LAB — HM MAMMOGRAPHY

## 2019-07-05 NOTE — Progress Notes (Signed)
Chief Complaint  Patient presents with  . Gynecologic Exam    Pap Only  . Rash    Left Arm    History of Present Illness: HPI  54 year old female presents to have her pap smear done. She had her CPX on 05/24/2019  No vaginal issues except mild dryness with sexual activity.    This visit occurred during the SARS-CoV-2 public health emergency.  Safety protocols were in place, including screening questions prior to the visit, additional usage of staff PPE, and extensive cleaning of exam room while observing appropriate contact time as indicated for disinfecting solutions.   COVID 19 screen:  No recent travel or known exposure to COVID19 The patient denies respiratory symptoms of COVID 19 at this time. The importance of social distancing was discussed today.     Review of Systems  Constitutional: Negative for chills and fever.  HENT: Negative for congestion and ear pain.   Eyes: Negative for pain and redness.  Respiratory: Negative for cough and shortness of breath.   Cardiovascular: Negative for chest pain, palpitations and leg swelling.  Gastrointestinal: Negative for abdominal pain, blood in stool, constipation, diarrhea, nausea and vomiting.  Genitourinary: Negative for dysuria.  Musculoskeletal: Negative for falls and myalgias.  Skin: Negative for rash.  Neurological: Negative for dizziness.  Psychiatric/Behavioral: Negative for depression. The patient is not nervous/anxious.       History reviewed. No pertinent past medical history.  reports that she has never smoked. She has never used smokeless tobacco. She reports that she does not drink alcohol or use drugs.   Current Outpatient Medications:  .  spironolactone (ALDACTONE) 50 MG tablet, Take 50 mg by mouth at bedtime. , Disp: , Rfl:  .  triamcinolone ointment (KENALOG) 0.1 %, , Disp: , Rfl:    Observations/Objective: Blood pressure 110/80, pulse 74, temperature 98.5 F (36.9 C), temperature source Temporal,  height 5\' 1"  (1.549 m), weight 137 lb 8 oz (62.4 kg), SpO2 99 %.  Physical Exam Constitutional:      General: She is not in acute distress.    Appearance: Normal appearance. She is well-developed. She is not ill-appearing or toxic-appearing.  HENT:     Head: Normocephalic.     Right Ear: Hearing, tympanic membrane, ear canal and external ear normal.     Left Ear: Hearing, tympanic membrane, ear canal and external ear normal.     Nose: Nose normal.  Eyes:     General: Lids are normal. Lids are everted, no foreign bodies appreciated.     Conjunctiva/sclera: Conjunctivae normal.     Pupils: Pupils are equal, round, and reactive to light.  Neck:     Thyroid: No thyroid mass or thyromegaly.     Vascular: No carotid bruit.     Trachea: Trachea normal.  Cardiovascular:     Rate and Rhythm: Normal rate and regular rhythm.     Heart sounds: Normal heart sounds, S1 normal and S2 normal. No murmur. No gallop.   Pulmonary:     Effort: Pulmonary effort is normal. No respiratory distress.     Breath sounds: Normal breath sounds. No wheezing, rhonchi or rales.  Abdominal:     General: Bowel sounds are normal. There is no distension or abdominal bruit.     Palpations: Abdomen is soft. There is no fluid wave or mass.     Tenderness: There is no abdominal tenderness. There is no guarding or rebound.     Hernia: No hernia is present.  Genitourinary:    Exam position: Supine.     Labia:        Right: No rash, tenderness or lesion.        Left: No rash, tenderness or lesion.      Vagina: Normal.     Cervix: No cervical motion tenderness, discharge or friability.     Uterus: Not enlarged and not tender.      Adnexa:        Right: No mass, tenderness or fullness.         Left: No mass, tenderness or fullness.    Musculoskeletal:     Cervical back: Normal range of motion and neck supple.  Lymphadenopathy:     Cervical: No cervical adenopathy.  Skin:    General: Skin is warm and dry.      Findings: No rash.  Neurological:     Mental Status: She is alert.     Cranial Nerves: No cranial nerve deficit.     Sensory: No sensory deficit.  Psychiatric:        Mood and Affect: Mood is not anxious or depressed.        Speech: Speech normal.        Behavior: Behavior normal. Behavior is cooperative.        Judgment: Judgment normal.      Assessment and Plan   PAP smear performed without complications.   Kerby Nora, MD

## 2019-07-05 NOTE — Addendum Note (Signed)
Addended by: Damita Lack on: 07/05/2019 10:24 AM   Modules accepted: Orders

## 2019-07-08 LAB — CYTOLOGY - PAP
Adequacy: ABSENT
Comment: NEGATIVE
Diagnosis: NEGATIVE
High risk HPV: NEGATIVE

## 2019-07-16 ENCOUNTER — Encounter: Payer: Self-pay | Admitting: Family Medicine

## 2020-02-03 IMAGING — MR MR HEAD W/O CM
10 of 11 series · 42 of 48 positions shown · non-contrast
Comparison: None.

CLINICAL DATA: Vertigo, episodic, peripheral.

Acute onset of dizziness, vomiting, and left arm numbness.
EXAM:
MRI HEAD WITHOUT CONTRAST
TECHNIQUE: Multiplanar, multiecho pulse sequences of the brain and surrounding
structures were obtained without intravenous contrast.

[Series 5: DWI · axial · 4.0mm · 0.88mm/px · z∈[-95,+55]mm · 8 of 80 slices shown (1 of 4)]
[im 1/80]
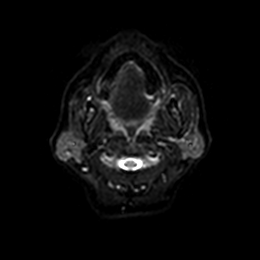
[im 12/80]
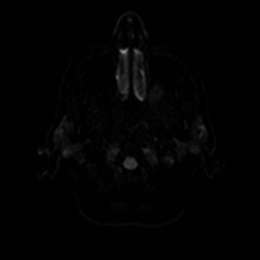
[im 23/80]
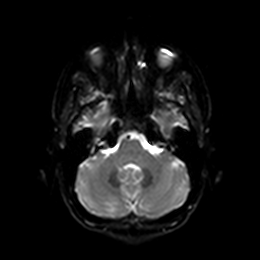
[im 34/80]
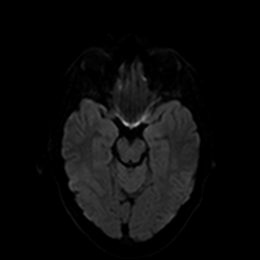
[im 46/80]
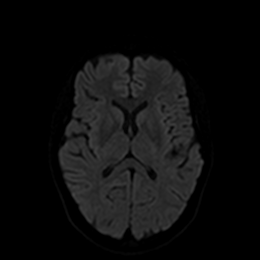
[im 57/80]
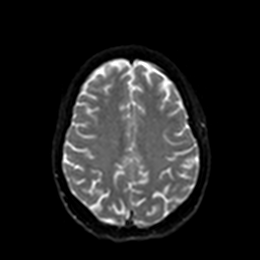
[im 68/80]
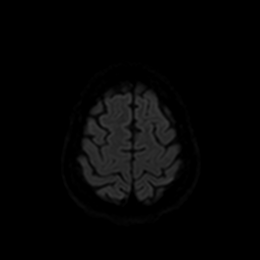
[im 80/80]
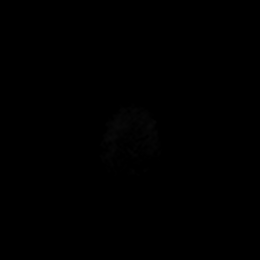

[Series 6: DWI · axial · 4.0mm · 0.88mm/px · z∈[-95,+55]mm · 4 of 40 slices shown (2 of 4)]
[im 1/40]
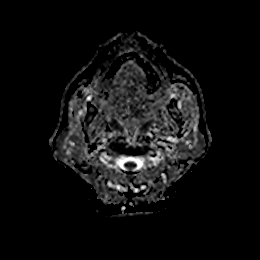
[im 14/40]
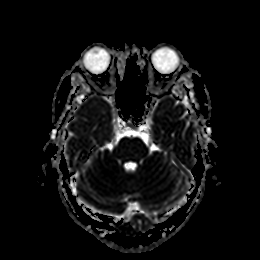
[im 27/40]
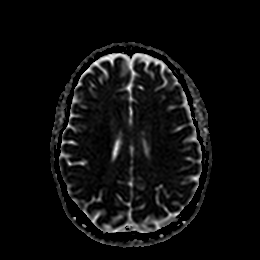
[im 40/40]
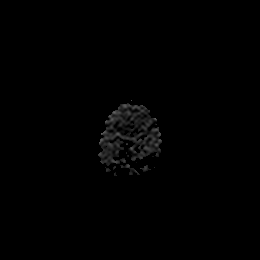

[Series 7: DWI · coronal · 4.0mm · 0.88mm/px · 7 of 76 slices shown (3 of 4)]
[im 1/76]
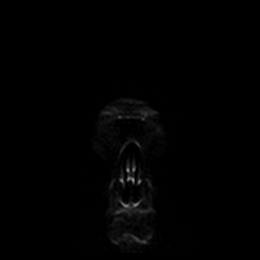
[im 13/76]
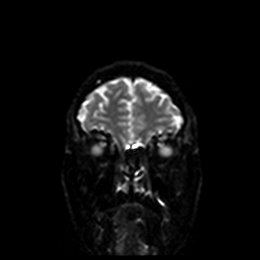
[im 26/76]
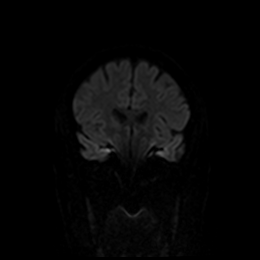
[im 38/76]
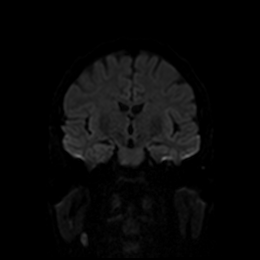
[im 51/76]
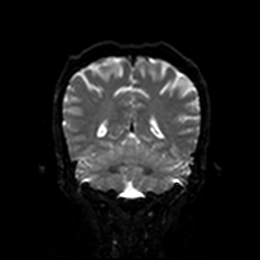
[im 63/76]
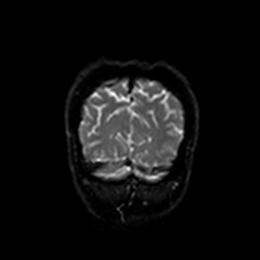
[im 76/76]
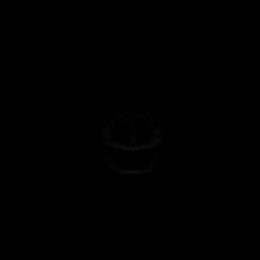

[Series 8: DWI · coronal · 4.0mm · 0.88mm/px · 4 of 38 slices shown (4 of 4)]
[im 1/38]
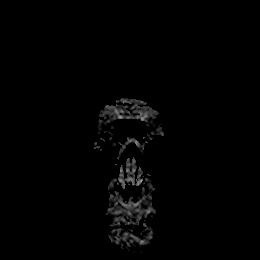
[im 13/38]
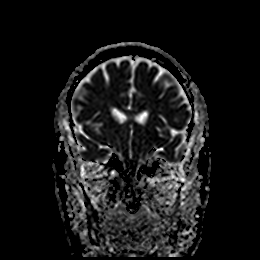
[im 25/38]
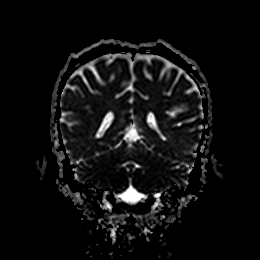
[im 38/38]
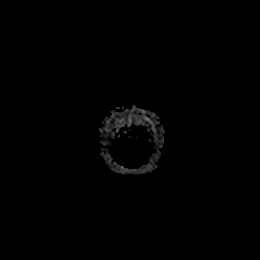

[Series 9: T1 · sagittal · 5.0mm · 0.75mm/px · 2 of 20 slices shown]
[im 1/20]
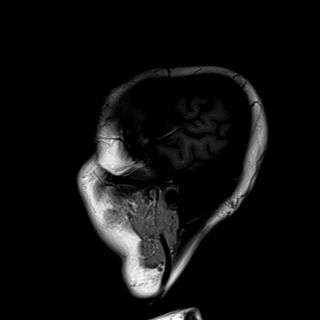
[im 20/20]
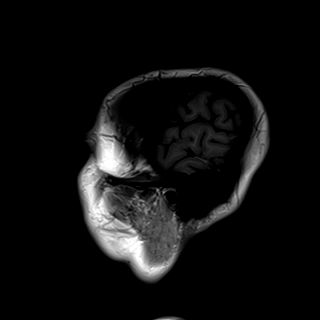

[Series 10: T2 · axial · 5.0mm · 0.72mm/px · z∈[-94,+45]mm · 2 of 25 slices shown (1 of 2)]
[im 1/25]
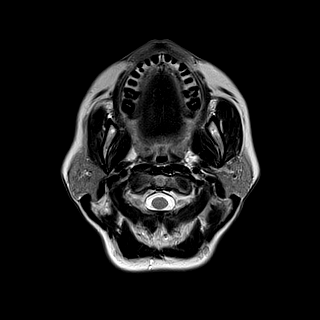
[im 25/25]
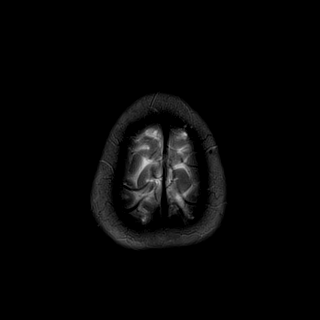

[Series 11: FLAIR · axial · 5.0mm · 0.45mm/px · z∈[-93,+45]mm · 2 of 25 slices shown]
[im 1/25]
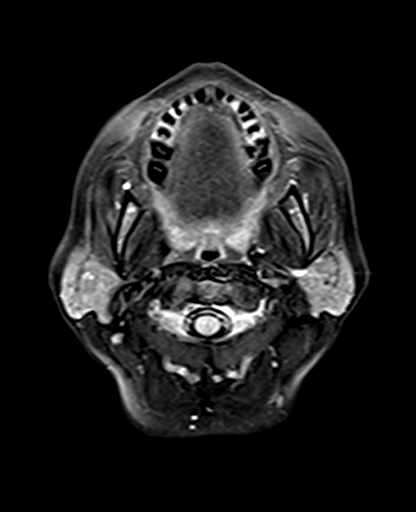
[im 25/25]
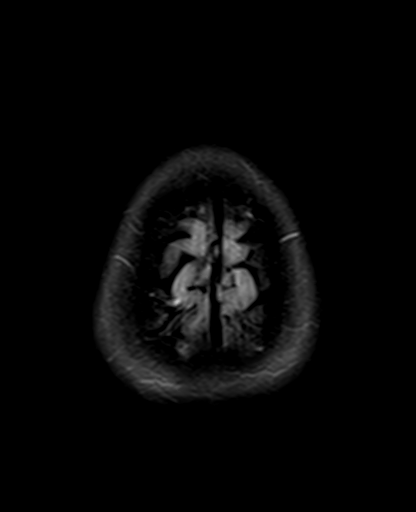

[Series 12: swi_images · axial · 3.0mm · 0.90mm/px · z∈[-105,+66]mm · 6 of 60 slices shown]
[im 1/60]
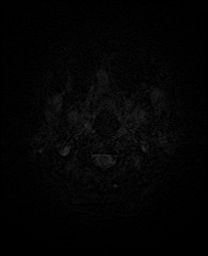
[im 12/60]
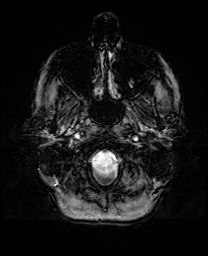
[im 24/60]
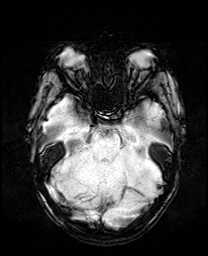
[im 36/60]
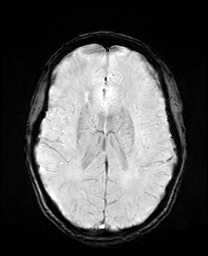
[im 48/60]
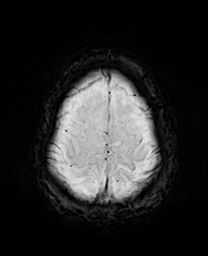
[im 60/60]
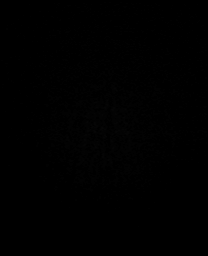

[Series 13: mip_images(sw) · axial · 24.0mm · 0.90mm/px · z∈[-95,+55]mm · 5 of 53 slices shown]
[im 1/53]
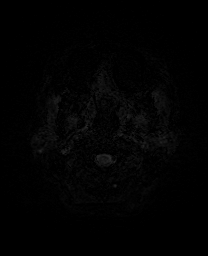
[im 14/53]
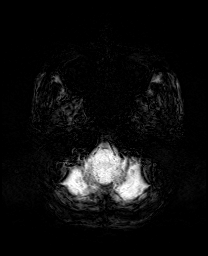
[im 27/53]
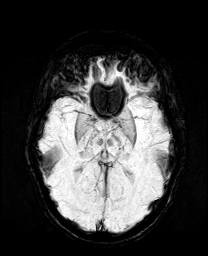
[im 40/53]
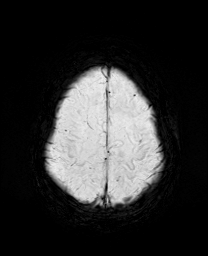
[im 53/53]
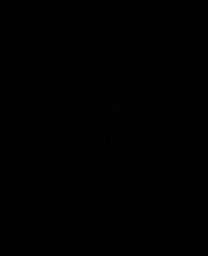

[Series 15: T2 · coronal · 5.0mm · 0.34mm/px · 2 of 26 slices shown (2 of 2)]
[im 1/26]
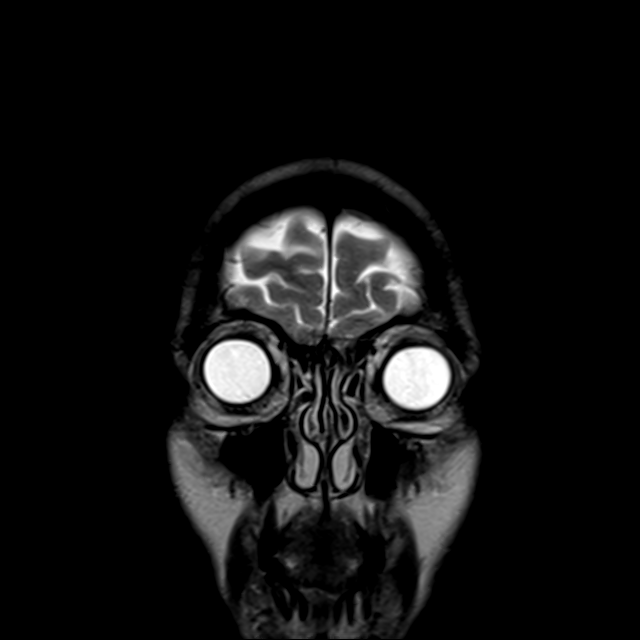
[im 26/26]
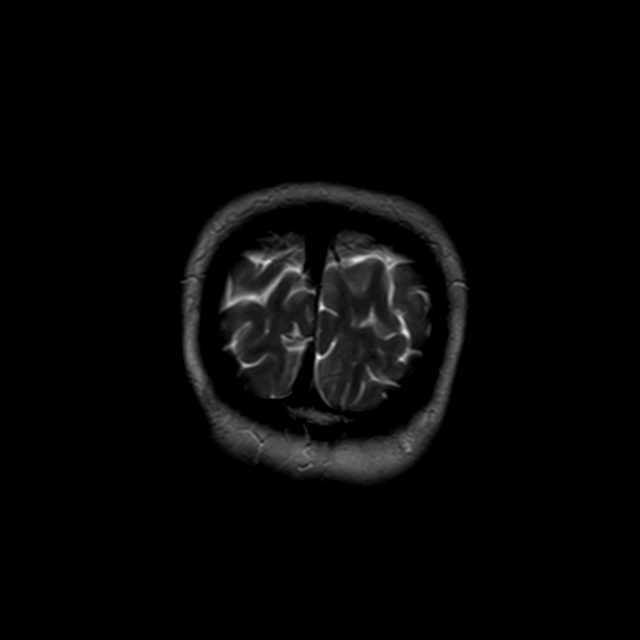

[42 of 48 positions shown; findings below may reference images not displayed]

FINDINGS: Brain: The diffusion-weighted images demonstrate no acute or
subacute infarction. No significant white matter disease is present.
Basal ganglia are within normal limits.

The internal auditory canals are within normal limits bilaterally.
Inner ear structures are unremarkable. Brainstem and cerebellum are
normal.

Vascular: Flow is present in the major intracranial arteries.

Skull and upper cervical spine: Skull base is within normal limits.
Craniocervical junction is normal. Upper cervical spine is within
normal limits.

Sinuses/Orbits: Polyp or mucous retention cyst is present in the
posterior inferior left maxillary sinus. The remaining paranasal
sinuses and the mastoid air cells are clear. Globes and orbits are
within normal limits.
IMPRESSION: 1. Negative MRI of the brain. No acute or focal lesion to explain
dizziness or upper extremity numbness.

## 2020-05-06 ENCOUNTER — Telehealth: Payer: Self-pay | Admitting: Family Medicine

## 2020-05-06 DIAGNOSIS — Z13220 Encounter for screening for lipoid disorders: Secondary | ICD-10-CM

## 2020-05-06 DIAGNOSIS — Z1159 Encounter for screening for other viral diseases: Secondary | ICD-10-CM

## 2020-05-06 NOTE — Telephone Encounter (Signed)
-----   Message from Aquilla Solian, RT sent at 05/04/2020 10:01 AM EDT ----- Regarding: Lab Orders for Friday 4.22.2022 Please place lab orders for Friday 4.22.2022, office visit for physical on Friday 4.29.2022 Thank you, Jones Bales RT(R)

## 2020-05-22 ENCOUNTER — Other Ambulatory Visit: Payer: 59

## 2020-05-29 ENCOUNTER — Encounter: Payer: 59 | Admitting: Family Medicine

## 2020-07-10 LAB — HM MAMMOGRAPHY

## 2020-07-14 ENCOUNTER — Encounter: Payer: Self-pay | Admitting: Family Medicine

## 2020-08-31 ENCOUNTER — Telehealth: Payer: Self-pay | Admitting: Family Medicine

## 2020-08-31 NOTE — Telephone Encounter (Signed)
Mrs. Strider called in wanted to know about seeing an allergist and who is recommended by Dr. Ermalene Searing and to possibly get a referral

## 2020-08-31 NOTE — Telephone Encounter (Signed)
Ms. Beecham has not been seen in over a year.  Will need appointment with Dr. Ermalene Searing before she can refer her to an allergist.  Please either schedule a CPE with fasting labs prior or an office visit to discuss allergist referral.

## 2020-09-24 ENCOUNTER — Telehealth: Payer: Self-pay | Admitting: Family Medicine

## 2020-09-24 DIAGNOSIS — Z13220 Encounter for screening for lipoid disorders: Secondary | ICD-10-CM

## 2020-09-24 DIAGNOSIS — Z1159 Encounter for screening for other viral diseases: Secondary | ICD-10-CM

## 2020-09-24 NOTE — Telephone Encounter (Signed)
-----   Message from Aquilla Solian, RT sent at 09/07/2020 10:18 AM EDT ----- Regarding: Lab Orders for Friday 8.26.2022 Please place lab orders for Friday 8.26.2022, office visit for physical on Friday 9.2.2022 Thank you, Jones Bales RT(R)

## 2020-09-25 ENCOUNTER — Other Ambulatory Visit: Payer: Self-pay

## 2020-09-25 ENCOUNTER — Other Ambulatory Visit (INDEPENDENT_AMBULATORY_CARE_PROVIDER_SITE_OTHER): Payer: 59

## 2020-09-25 DIAGNOSIS — Z13220 Encounter for screening for lipoid disorders: Secondary | ICD-10-CM

## 2020-09-25 DIAGNOSIS — Z1159 Encounter for screening for other viral diseases: Secondary | ICD-10-CM

## 2020-09-25 LAB — COMPREHENSIVE METABOLIC PANEL
ALT: 11 U/L (ref 0–35)
AST: 16 U/L (ref 0–37)
Albumin: 4.1 g/dL (ref 3.5–5.2)
Alkaline Phosphatase: 80 U/L (ref 39–117)
BUN: 17 mg/dL (ref 6–23)
CO2: 30 mEq/L (ref 19–32)
Calcium: 9.2 mg/dL (ref 8.4–10.5)
Chloride: 103 mEq/L (ref 96–112)
Creatinine, Ser: 0.9 mg/dL (ref 0.40–1.20)
GFR: 73.27 mL/min (ref >60.00)
Glucose, Bld: 84 mg/dL (ref 70–99)
Potassium: 4.4 mEq/L (ref 3.5–5.1)
Sodium: 139 mEq/L (ref 135–145)
Total Bilirubin: 0.4 mg/dL (ref 0.2–1.2)
Total Protein: 7.3 g/dL (ref 6.0–8.3)

## 2020-09-25 LAB — LIPID PANEL
Cholesterol: 189 mg/dL (ref 0–200)
HDL: 68.5 mg/dL (ref >39.00)
LDL Cholesterol: 112 mg/dL — ABNORMAL HIGH (ref 0–99)
NonHDL: 120.87
Total CHOL/HDL Ratio: 3
Triglycerides: 45 mg/dL (ref 0.0–149.0)
VLDL: 9 mg/dL (ref 0.0–40.0)

## 2020-09-25 NOTE — Progress Notes (Signed)
No critical labs need to be addressed urgently. We will discuss labs in detail at upcoming office visit.   

## 2020-09-28 LAB — HEPATITIS C ANTIBODY
Hepatitis C Ab: NONREACTIVE
SIGNAL TO CUT-OFF: 0.04 (ref <1.00)

## 2020-09-28 NOTE — Progress Notes (Signed)
No critical labs need to be addressed urgently. We will discuss labs in detail at upcoming office visit.   

## 2020-10-02 ENCOUNTER — Ambulatory Visit (INDEPENDENT_AMBULATORY_CARE_PROVIDER_SITE_OTHER): Payer: 59 | Admitting: Family Medicine

## 2020-10-02 ENCOUNTER — Other Ambulatory Visit: Payer: Self-pay

## 2020-10-02 ENCOUNTER — Encounter: Payer: Self-pay | Admitting: Family Medicine

## 2020-10-02 VITALS — BP 92/70 | HR 79 | Temp 97.3°F | Ht 60.75 in | Wt 151.5 lb

## 2020-10-02 DIAGNOSIS — Z0000 Encounter for general adult medical examination without abnormal findings: Secondary | ICD-10-CM | POA: Diagnosis not present

## 2020-10-02 DIAGNOSIS — L299 Pruritus, unspecified: Secondary | ICD-10-CM

## 2020-10-02 DIAGNOSIS — M25562 Pain in left knee: Secondary | ICD-10-CM | POA: Diagnosis not present

## 2020-10-02 NOTE — Progress Notes (Signed)
Patient ID: Laura Cox, female    DOB: [birth year], 55 y.o.   MRN: [id number]  This visit was conducted in person.  BP 92/70   Pulse 79   Temp (!) 97.3 F (36.3 C) (Temporal)   Ht 5' 0.75" (1.543 m)   Wt 151 lb 8 oz (68.7 kg)   LMP 08/11/2020   SpO2 98%   BMI 28.86 kg/m    CC: Chief Complaint  Patient presents with   Annual Exam   Subjective:   HPI: Laura Cox is a 55 y.o. female presenting on 10/02/2020 for Annual Exam   She has dry itchy rash on upper arms x 2 years. Thickened skin.  Now occ starts on chest.  Has seen dermatologist. Told was brachioradialis pruritis. Kenalog occ helps.  Lab Results  Component Value Date   CHOL 189 09/25/2020   HDL 68.50 09/25/2020   LDLCALC 112 (H) 09/25/2020   TRIG 45.0 09/25/2020   CHOLHDL 3 09/25/2020    Exercise: none  Diet: moderate  The 10-year ASCVD risk score Denman George DC Jr., et al., 2013) is: 0.6%   Values used to calculate the score:     Age: 57 years     Sex: Female     Is Non-Hispanic African American: No     Diabetic: No     Tobacco smoker: No     Systolic Blood Pressure: 92 mmHg     Is BP treated: No     HDL Cholesterol: 68.5 mg/dL     Total Cholesterol: 189 mg/dL     Occ intermittent right knee pain, worse with walking. No known injury, no fall.  No redness, no swelling  Relevant past medical, surgical, family and social history reviewed and updated as indicated. Interim medical history since our last visit reviewed. Allergies and medications reviewed and updated. Outpatient Medications Prior to Visit  Medication Sig Dispense Refill   spironolactone (ALDACTONE) 50 MG tablet Take 50 mg by mouth at bedtime.      triamcinolone ointment (KENALOG) 0.1 %      No facility-administered medications prior to visit.     Per HPI unless specifically indicated in ROS section below Review of Systems  Constitutional:  Negative for fatigue and fever.  HENT:  Negative for congestion.   Eyes:  Negative for  pain.  Respiratory:  Negative for cough and shortness of breath.   Cardiovascular:  Negative for chest pain, palpitations and leg swelling.  Gastrointestinal:  Negative for abdominal pain.  Genitourinary:  Negative for dysuria and vaginal bleeding.  Musculoskeletal:  Negative for back pain.  Neurological:  Negative for syncope, light-headedness and headaches.  Psychiatric/Behavioral:  Negative for dysphoric mood.   Objective:  BP 92/70   Pulse 79   Temp (!) 97.3 F (36.3 C) (Temporal)   Ht 5' 0.75" (1.543 m)   Wt 151 lb 8 oz (68.7 kg)   LMP 08/11/2020   SpO2 98%   BMI 28.86 kg/m   Wt Readings from Last 3 Encounters:  10/02/20 151 lb 8 oz (68.7 kg)  07/05/19 137 lb 8 oz (62.4 kg)  05/24/19 141 lb 8 oz (64.2 kg)     Body mass index is 28.86 kg/m.  BP Readings from Last 3 Encounters:  10/02/20 92/70  07/05/19 110/80  05/24/19 90/70    Physical Exam Vitals and nursing note reviewed.  Constitutional:      General: She is not in acute distress.    Appearance: Normal appearance. She is  well-developed. She is not ill-appearing or toxic-appearing.  HENT:     Head: Normocephalic.     Right Ear: Hearing, tympanic membrane, ear canal and external ear normal.     Left Ear: Hearing, tympanic membrane, ear canal and external ear normal.     Nose: Nose normal.  Eyes:     General: Lids are normal. Lids are everted, no foreign bodies appreciated.     Conjunctiva/sclera: Conjunctivae normal.     Pupils: Pupils are equal, round, and reactive to light.  Neck:     Thyroid: No thyroid mass or thyromegaly.     Vascular: No carotid bruit.     Trachea: Trachea normal.  Cardiovascular:     Rate and Rhythm: Normal rate and regular rhythm.     Heart sounds: Normal heart sounds, S1 normal and S2 normal. No murmur heard.   No gallop.  Pulmonary:     Effort: Pulmonary effort is normal. No respiratory distress.     Breath sounds: Normal breath sounds. No wheezing, rhonchi or rales.   Abdominal:     General: Bowel sounds are normal. There is no distension or abdominal bruit.     Palpations: Abdomen is soft. There is no fluid wave or mass.     Tenderness: There is no abdominal tenderness. There is no guarding or rebound.     Hernia: No hernia is present.  Musculoskeletal:     Cervical back: Normal range of motion and neck supple.     Right knee: No bony tenderness or crepitus. Normal range of motion. No tenderness. No medial joint line, lateral joint line, MCL, LCL, ACL, PCL or patellar tendon tenderness.     Left knee: Normal.  Lymphadenopathy:     Cervical: No cervical adenopathy.  Skin:    General: Skin is warm and dry.     Findings: No rash.  Neurological:     Mental Status: She is alert.     Cranial Nerves: No cranial nerve deficit.     Sensory: No sensory deficit.  Psychiatric:        Mood and Affect: Mood is not anxious or depressed.        Speech: Speech normal.        Behavior: Behavior normal. Behavior is cooperative.        Judgment: Judgment normal.      Results for orders placed or performed in visit on 09/25/20  Lipid panel  Result Value Ref Range   Cholesterol 189 0 - 200 mg/dL   Triglycerides 60.6 0.0 - 149.0 mg/dL   HDL 30.16 >01.09 mg/dL   VLDL 9.0 0.0 - 32.3 mg/dL   LDL Cholesterol 557 (H) 0 - 99 mg/dL   Total CHOL/HDL Ratio 3    NonHDL 120.87   Comprehensive metabolic panel  Result Value Ref Range   Sodium 139 135 - 145 mEq/L   Potassium 4.4 3.5 - 5.1 mEq/L   Chloride 103 96 - 112 mEq/L   CO2 30 19 - 32 mEq/L   Glucose, Bld 84 70 - 99 mg/dL   BUN 17 6 - 23 mg/dL   Creatinine, Ser 3.22 0.40 - 1.20 mg/dL   Total Bilirubin 0.4 0.2 - 1.2 mg/dL   Alkaline Phosphatase 80 39 - 117 U/L   AST 16 0 - 37 U/L   ALT 11 0 - 35 U/L   Total Protein 7.3 6.0 - 8.3 g/dL   Albumin 4.1 3.5 - 5.2 g/dL   GFR 02.54 >27.06 mL/min  Calcium 9.2 8.4 - 10.5 mg/dL  Hepatitis C antibody  Result Value Ref Range   Hepatitis C Ab NON-REACTIVE NON-REACTIVE    SIGNAL TO CUT-OFF 0.04 <1.00    This visit occurred during the SARS-CoV-2 public health emergency.  Safety protocols were in place, including screening questions prior to the visit, additional usage of staff PPE, and extensive cleaning of exam room while observing appropriate contact time as indicated for disinfecting solutions.   COVID 19 screen:  No recent travel or known exposure to COVID19 The patient denies respiratory symptoms of COVID 19 at this time. The importance of social distancing was discussed today.   Assessment and Plan The patient's preventative maintenance and recommended screening tests for an annual wellness exam were reviewed in full today. Brought up to date unless services declined.  Counselled on the importance of diet, exercise, and its role in overall health and mortality. The patient's FH and SH was reviewed, including their home life, tobacco status, and drug and alcohol status.   Vaccines: uptodate Tdap.  consider shingrix, COVID19 series x 2 Pap/DVE:  2021 neg HPV and pap Mammo: 07/10/20 Colon:  No early family history. Due .. planning in Dec. Smoking Status: non smoker ETOH/ drug QPR:FFMB/WGYK  HIV screen:  refused   Hep C den   Problem List Items Addressed This Visit     Acute pain of left knee    Trial of topical Voltaren gel on sore area on left knee 2-4 times daily.Marland Kitchen if not improving  we can set you up with Dr. Pats[facility].       Brachioradial pruritus    Chronic, inadequate control.  Start a trial of zyrtec at bedtime or Claritin in AM for rash on arms. Keep food/rash diary.   Call or email with improvement in 3-4 weeks for possible second opinion referral to derm.      Other Visit Diagnoses     Routine general medical examination at a health care facility    -  Primary       Kerby Nora, MD

## 2020-10-02 NOTE — Patient Instructions (Addendum)
Start a trial of zyrtec at bedtime or Claritin in AM for rash on arms. Keep food/rash diary.   Call or email with improvement in 3-4 weeks for possible second opinion referral to derm.  Can use topical Voltaren gel on sore area on left knee 2-4 times daily.Marland Kitchen if not improving  we can set you up with Dr. Patsy [facility].   Work on [employer] and regular exercise

## 2020-11-04 DIAGNOSIS — L299 Pruritus, unspecified: Secondary | ICD-10-CM | POA: Insufficient documentation

## 2020-11-04 DIAGNOSIS — M25562 Pain in left knee: Secondary | ICD-10-CM | POA: Insufficient documentation

## 2020-11-04 NOTE — Assessment & Plan Note (Signed)
Trial of topical Voltaren gel on sore area on left knee 2-4 times daily.Marland Kitchen if not improving  we can set you up with Dr. Patsy [facility].

## 2020-11-04 NOTE — Assessment & Plan Note (Signed)
Chronic, inadequate control.  Start a trial of zyrtec at bedtime or Claritin in AM for rash on arms. Keep food/rash diary.   Call or email with improvement in 3-4 weeks for possible second opinion referral to derm.

## 2021-05-18 ENCOUNTER — Ambulatory Visit (INDEPENDENT_AMBULATORY_CARE_PROVIDER_SITE_OTHER): Payer: 59 | Admitting: Family Medicine

## 2021-05-18 ENCOUNTER — Encounter: Payer: Self-pay | Admitting: Family Medicine

## 2021-05-18 VITALS — BP 100/70 | HR 78 | Temp 98.1°F | Ht 60.75 in | Wt 158.0 lb

## 2021-05-18 DIAGNOSIS — R051 Acute cough: Secondary | ICD-10-CM | POA: Diagnosis not present

## 2021-05-18 MED ORDER — GUAIFENESIN-CODEINE 100-10 MG/5ML PO SYRP
5.0000 mL | ORAL_SOLUTION | Freq: Every evening | ORAL | 0 refills | Status: AC | PRN
Start: 2021-05-18 — End: ?

## 2021-05-18 MED ORDER — ALBUTEROL SULFATE HFA 108 (90 BASE) MCG/ACT IN AERS: 2.0000 | INHALATION_SPRAY | Freq: Four times a day (QID) | RESPIRATORY_TRACT | 2 refills | Status: AC | PRN

## 2021-05-18 MED ORDER — PREDNISONE 20 MG PO TABS: ORAL_TABLET | ORAL | 0 refills | Status: AC

## 2021-05-18 MED ORDER — AZITHROMYCIN 250 MG PO TABS: ORAL_TABLET | ORAL | 0 refills | Status: AC

## 2021-05-18 NOTE — Assessment & Plan Note (Signed)
Acute, no change ? ?Most likely viral URI.  She is not interested in COVID testing as she is past the time course for treatment with antiviral. ?Given her chest tightness... I will treat for likely viral bronchitis ?She will use cough suppressant at night as well as mucolytic during the day.  She will complete a prednisone taper and can use albuterol as needed for coughing fits.  If she is not improving in the next 2 to 3 days or if she has new onset fever and productive cough she can complete a course of azithromycin.  The azithromycin prescription will be sent to the pharmacy for them to hold. ?

## 2021-05-18 NOTE — Progress Notes (Signed)
? ? Patient ID: Laura Cox, female    DOB: [birth year], 56 y.o.   MRN: [id number] ? ?This visit was conducted in person. ? ?BP 100/70   Pulse 78   Temp 98.1 ?F (36.7 ?C) (Oral)   Ht 5' 0.75" (1.543 m)   Wt 158 lb (71.7 kg)   SpO2 97%   BMI 30.10 kg/m?   ? ?CC:  ?Chief Complaint  ?Patient presents with  ? Cough  ?  With chest congestion x 1 week ?No Covid test done  ? ? ?Subjective:  ? ?HPI: ?Laura Cox is a 56 y.o. female presenting on 05/18/2021 for Cough (With chest congestion x 1 week/No Covid test done) ? ?Date of onset 1 week ago ? ? She reports new onset chest congestion and cough.  Dry cough, occ productive. ? No ST, no ear pain, no face pain, no fever, no body aches. ?  No change over time. ? Cough keeping her up at night ? No sick contacts. ? ?She has tried treating with tylenol cold.. did not help. ? ? No SOB, no wheeze ? ? ? Works for an [employer]. ? ? No asthma or COPD. ? No immunocompromised. ? ?COVID 19 screen ?COVID testing: none ?COVID vaccine:   03/26/2019 , 03/04/2019 ?COVID exposure: No recent travel or known exposure to COVID19 ? The importance of social distancing was discussed today.   ?   ? ?Relevant past medical, surgical, family and social history reviewed and updated as indicated. Interim medical history since our last visit reviewed. ?Allergies and medications reviewed and updated. ?Outpatient Medications Prior to Visit  ?Medication Sig Dispense Refill  ? Black Pepper-Turmeric (TURMERIC PLUS BLACK PEPPER EXT PO) Take 2 tablets by mouth daily.    ? COLLAGEN PO Take 1 Scoop by mouth daily.    ? Fish Oil-Cholecalciferol (OMEGA-3 + VITAMIN D3 PO) Take 2 capsules by mouth in the morning and at bedtime.    ? Magnesium Bisglycinate (MAG GLYCINATE PO) Take 1 tablet by mouth daily.    ? TURMERIC PO Take 2 tablets by mouth daily.    ? ?No facility-administered medications prior to visit.  ?  ? ?Per HPI unless specifically indicated in ROS section below ?Review of Systems   ?Constitutional:  Negative for fatigue and fever.  ?HENT:  Negative for congestion.   ?Eyes:  Negative for pain.  ?Respiratory:  Negative for cough and shortness of breath.   ?Cardiovascular:  Negative for chest pain, palpitations and leg swelling.  ?Gastrointestinal:  Negative for abdominal pain.  ?Genitourinary:  Negative for dysuria and vaginal bleeding.  ?Musculoskeletal:  Negative for back pain.  ?Neurological:  Negative for syncope, light-headedness and headaches.  ?Psychiatric/Behavioral:  Negative for dysphoric mood.   ?Objective:  ?BP 100/70   Pulse 78   Temp 98.1 ?F (36.7 ?C) (Oral)   Ht 5' 0.75" (1.543 m)   Wt 158 lb (71.7 kg)   SpO2 97%   BMI 30.10 kg/m?   ?Wt Readings from Last 3 Encounters:  ?05/18/21 158 lb (71.7 kg)  ?10/02/20 151 lb 8 oz (68.7 kg)  ?07/05/19 137 lb 8 oz (62.4 kg)  ?  ?  ?Physical Exam ?Constitutional:   ?   General: She is not in acute distress. ?   Appearance: Normal appearance. She is well-developed. She is not ill-appearing or toxic-appearing.  ?HENT:  ?   Head: Normocephalic.  ?   Right Ear: Hearing, tympanic membrane, ear canal and external ear normal. Tympanic membrane  is not erythematous, retracted or bulging.  ?   Left Ear: Hearing, tympanic membrane, ear canal and external ear normal. Tympanic membrane is not erythematous, retracted or bulging.  ?   Nose: No mucosal edema or rhinorrhea.  ?   Right Sinus: No maxillary sinus tenderness or frontal sinus tenderness.  ?   Left Sinus: No maxillary sinus tenderness or frontal sinus tenderness.  ?   Mouth/Throat:  ?   Pharynx: Uvula midline.  ?Eyes:  ?   General: Lids are normal. Lids are everted, no foreign bodies appreciated.  ?   Conjunctiva/sclera: Conjunctivae normal.  ?   Pupils: Pupils are equal, round, and reactive to light.  ?Neck:  ?   Thyroid: No thyroid mass or thyromegaly.  ?   Vascular: No carotid bruit.  ?   Trachea: Trachea normal.  ?Cardiovascular:  ?   Rate and Rhythm: Normal rate and regular rhythm.  ?    Pulses: Normal pulses.  ?   Heart sounds: Normal heart sounds, S1 normal and S2 normal. No murmur heard. ?  No friction rub. No gallop.  ?Pulmonary:  ?   Effort: Pulmonary effort is normal. No tachypnea or respiratory distress.  ?   Breath sounds: Normal breath sounds. No decreased breath sounds, wheezing, rhonchi or rales.  ?Abdominal:  ?   General: Bowel sounds are normal.  ?   Palpations: Abdomen is soft.  ?   Tenderness: There is no abdominal tenderness.  ?Musculoskeletal:  ?   Cervical back: Normal range of motion and neck supple.  ?Skin: ?   General: Skin is warm and dry.  ?   Findings: No rash.  ?Neurological:  ?   Mental Status: She is alert.  ?Psychiatric:     ?   Mood and Affect: Mood is not anxious or depressed.     ?   Speech: Speech normal.     ?   Behavior: Behavior normal. Behavior is cooperative.     ?   Thought Content: Thought content normal.     ?   Judgment: Judgment normal.  ? ?   ?Results for orders placed or performed in visit on 09/25/20  ?Lipid panel  ?Result Value Ref Range  ? Cholesterol 189 0 - 200 mg/dL  ? Triglycerides 45.0 0.0 - 149.0 mg/dL  ? HDL 68.50 >39.00 mg/dL  ? VLDL 9.0 0.0 - 40.0 mg/dL  ? LDL Cholesterol 112 (H) 0 - 99 mg/dL  ? Total CHOL/HDL Ratio 3   ? NonHDL 120.87   ?Comprehensive metabolic panel  ?Result Value Ref Range  ? Sodium 139 135 - 145 mEq/L  ? Potassium 4.4 3.5 - 5.1 mEq/L  ? Chloride 103 96 - 112 mEq/L  ? CO2 30 19 - 32 mEq/L  ? Glucose, Bld 84 70 - 99 mg/dL  ? BUN 17 6 - 23 mg/dL  ? Creatinine, Ser 0.90 0.40 - 1.20 mg/dL  ? Total Bilirubin 0.4 0.2 - 1.2 mg/dL  ? Alkaline Phosphatase 80 39 - 117 U/L  ? AST 16 0 - 37 U/L  ? ALT 11 0 - 35 U/L  ? Total Protein 7.3 6.0 - 8.3 g/dL  ? Albumin 4.1 3.5 - 5.2 g/dL  ? GFR 73.27 >60.00 mL/min  ? Calcium 9.2 8.4 - 10.5 mg/dL  ?Hepatitis C antibody  ?Result Value Ref Range  ? Hepatitis C Ab NON-REACTIVE NON-REACTIVE  ? SIGNAL TO CUT-OFF 0.04 <1.00  ? ? ?This visit occurred during the SARS-CoV-2 public health emergency.  Safety protocols were in place, including screening questions prior to the visit, additional usage of staff PPE, and extensive cleaning of exam room while observing appropriate contact time as indicated for disinfecting solutions.  ? ?COVID 19 screen:  No recent travel or known exposure to COVID19 ?The patient denies respiratory symptoms of COVID 19 at this time. ?The importance of social distancing was discussed today.  ? ?Assessment and Plan ? ?  ?Problem List Items Addressed This Visit   ? ? Acute cough - Primary  ?  Acute, no change ? ?Most likely viral URI.  She is not interested in COVID testing as she is past the time course for treatment with antiviral. ?Given her chest tightness... I will treat for likely viral bronchitis ?She will use cough suppressant at night as well as mucolytic during the day.  She will complete a prednisone taper and can use albuterol as needed for coughing fits.  If she is not improving in the next 2 to 3 days or if she has new onset fever and productive cough she can complete a course of azithromycin.  The azithromycin prescription will be sent to the pharmacy for them to hold. ? ?  ?  ? ? ? ?Kerby Nora, MD  ? ?

## 2021-05-18 NOTE — Patient Instructions (Addendum)
Likely viral bronchitis. ?Rest, fluids. ? Complete prednisone taper. ? Can use albuterol for coughing fits. Can use cough suppressant at night  as needed. ? If not improving in -3 days or if new fever ... fill prescription for antibiotic. ?

## 2021-07-16 LAB — HM MAMMOGRAPHY

## 2021-07-21 ENCOUNTER — Encounter: Payer: Self-pay | Admitting: Family Medicine

## 2022-07-18 LAB — HM MAMMOGRAPHY

## 2023-12-01 LAB — HM MAMMOGRAPHY

## 2023-12-04 ENCOUNTER — Encounter: Payer: Self-pay | Admitting: Family Medicine

## 2023-12-05 ENCOUNTER — Ambulatory Visit: Payer: Self-pay | Admitting: Family Medicine
